# Patient Record
Sex: Male | Born: 1970 | Hispanic: Yes | Marital: Single
Health system: Southern US, Community
[De-identification: ages and names within clinical notes are randomized; demographics above are authoritative.]

---

## 2005-09-20 ENCOUNTER — Emergency Department: Payer: Self-pay | Admitting: General Practice

## 2007-05-25 ENCOUNTER — Other Ambulatory Visit: Payer: Self-pay

## 2007-05-25 ENCOUNTER — Emergency Department: Payer: Self-pay | Admitting: Emergency Medicine

## 2008-05-24 ENCOUNTER — Emergency Department: Payer: Self-pay | Admitting: Emergency Medicine

## 2008-06-01 ENCOUNTER — Emergency Department: Payer: Self-pay | Admitting: Emergency Medicine

## 2008-07-17 ENCOUNTER — Emergency Department: Payer: Self-pay | Admitting: Internal Medicine

## 2009-10-19 ENCOUNTER — Emergency Department: Payer: Self-pay | Admitting: Emergency Medicine

## 2012-03-25 ENCOUNTER — Inpatient Hospital Stay: Payer: Self-pay | Admitting: Internal Medicine

## 2012-03-25 LAB — CBC WITH DIFFERENTIAL/PLATELET
Basophil %: 0.1 %
Eosinophil #: 0 10*3/uL (ref 0.0–0.7)
Eosinophil %: 0 %
HGB: 14.2 g/dL (ref 13.0–18.0)
Lymphocyte #: 0.4 10*3/uL — ABNORMAL LOW (ref 1.0–3.6)
Lymphocyte %: 3.7 %
MCH: 29.9 pg (ref 26.0–34.0)
MCV: 88 fL (ref 80–100)
Monocyte %: 2.7 %
Neutrophil #: 10.8 10*3/uL — ABNORMAL HIGH (ref 1.4–6.5)
Neutrophil %: 93.5 %
Platelet: 148 10*3/uL — ABNORMAL LOW (ref 150–440)
RDW: 13.1 % (ref 11.5–14.5)

## 2012-03-25 LAB — RAPID INFLUENZA A&B ANTIGENS

## 2012-03-25 LAB — COMPREHENSIVE METABOLIC PANEL
Anion Gap: 11 (ref 7–16)
BUN: 13 mg/dL (ref 7–18)
EGFR (African American): >60
EGFR (Non-African Amer.): >60
Glucose: 190 mg/dL — ABNORMAL HIGH (ref 65–99)
SGOT(AST): 42 U/L — ABNORMAL HIGH (ref 15–37)
SGPT (ALT): 38 U/L (ref 12–78)
Sodium: 130 mmol/L — ABNORMAL LOW (ref 136–145)

## 2012-03-26 LAB — CBC WITH DIFFERENTIAL/PLATELET
Eosinophil #: 0 10*3/uL (ref 0.0–0.7)
Eosinophil %: 0 %
HCT: 39.8 % — ABNORMAL LOW (ref 40.0–52.0)
HGB: 13.3 g/dL (ref 13.0–18.0)
Lymphocyte #: 0.7 10*3/uL — ABNORMAL LOW (ref 1.0–3.6)
Monocyte %: 3 %
Neutrophil %: 87.6 %
Platelet: 148 10*3/uL — ABNORMAL LOW (ref 150–440)
RBC: 4.37 10*6/uL — ABNORMAL LOW (ref 4.40–5.90)
RDW: 13 % (ref 11.5–14.5)

## 2012-03-26 LAB — MAGNESIUM: Magnesium: 1.8 mg/dL

## 2012-03-26 LAB — COMPREHENSIVE METABOLIC PANEL
Albumin: 3 g/dL — ABNORMAL LOW (ref 3.4–5.0)
Alkaline Phosphatase: 99 U/L (ref 50–136)
Anion Gap: 9 (ref 7–16)
Bilirubin,Total: 0.8 mg/dL (ref 0.2–1.0)
Calcium, Total: 7.9 mg/dL — ABNORMAL LOW (ref 8.5–10.1)
Chloride: 99 mmol/L (ref 98–107)
Creatinine: 0.91 mg/dL (ref 0.60–1.30)
EGFR (African American): >60
Glucose: 156 mg/dL — ABNORMAL HIGH (ref 65–99)
Potassium: 3.8 mmol/L (ref 3.5–5.1)
SGPT (ALT): 33 U/L (ref 12–78)
Total Protein: 7.2 g/dL (ref 6.4–8.2)

## 2012-03-26 LAB — LIPID PANEL
Cholesterol: 89 mg/dL (ref 0–200)
Ldl Cholesterol, Calc: 27 mg/dL (ref 0–100)
Triglycerides: 291 mg/dL — ABNORMAL HIGH (ref 0–200)
VLDL Cholesterol, Calc: 58 mg/dL — ABNORMAL HIGH (ref 5–40)

## 2012-03-27 LAB — RAPID HIV-1/2 QL/CONFIRM: HIV-1/2,Rapid Ql: NEGATIVE

## 2012-03-28 LAB — CBC WITH DIFFERENTIAL/PLATELET
Basophil %: 0.2 %
Eosinophil #: 0 10*3/uL (ref 0.0–0.7)
HCT: 37.2 % — ABNORMAL LOW (ref 40.0–52.0)
HGB: 12.6 g/dL — ABNORMAL LOW (ref 13.0–18.0)
Lymphocyte #: 1 10*3/uL (ref 1.0–3.6)
Lymphocyte %: 6.4 %
MCH: 31.1 pg (ref 26.0–34.0)
MCHC: 33.9 g/dL (ref 32.0–36.0)
MCV: 92 fL (ref 80–100)
Monocyte #: 0.6 x10 3/mm (ref 0.2–1.0)
Monocyte %: 3.5 %
Neutrophil #: 14.1 10*3/uL — ABNORMAL HIGH (ref 1.4–6.5)
RBC: 4.06 10*6/uL — ABNORMAL LOW (ref 4.40–5.90)
WBC: 15.7 10*3/uL — ABNORMAL HIGH (ref 3.8–10.6)

## 2012-03-28 LAB — BASIC METABOLIC PANEL
Chloride: 98 mmol/L (ref 98–107)
Sodium: 132 mmol/L — ABNORMAL LOW (ref 136–145)

## 2012-03-28 LAB — PHOSPHORUS: Phosphorus: 3.8 mg/dL (ref 2.5–4.9)

## 2012-03-28 LAB — BETA STREP CULTURE(ARMC)

## 2012-03-29 LAB — CBC WITH DIFFERENTIAL/PLATELET
Basophil #: 0 10*3/uL (ref 0.0–0.1)
Basophil %: 0.2 %
Eosinophil #: 0 10*3/uL (ref 0.0–0.7)
HCT: 34.8 % — ABNORMAL LOW (ref 40.0–52.0)
HGB: 11.4 g/dL — ABNORMAL LOW (ref 13.0–18.0)
Lymphocyte #: 1 10*3/uL (ref 1.0–3.6)
MCH: 30.2 pg (ref 26.0–34.0)
MCHC: 32.6 g/dL (ref 32.0–36.0)
MCV: 93 fL (ref 80–100)
Monocyte #: 0.8 x10 3/mm (ref 0.2–1.0)
Monocyte %: 4.4 %
Neutrophil #: 15.2 10*3/uL — ABNORMAL HIGH (ref 1.4–6.5)
Platelet: 243 10*3/uL (ref 150–440)
RBC: 3.76 10*6/uL — ABNORMAL LOW (ref 4.40–5.90)
RDW: 13.4 % (ref 11.5–14.5)
WBC: 17 10*3/uL — ABNORMAL HIGH (ref 3.8–10.6)

## 2012-03-29 LAB — BASIC METABOLIC PANEL
Co2: 30 mmol/L (ref 21–32)
EGFR (African American): >60
EGFR (Non-African Amer.): >60
Osmolality: 284 (ref 275–301)

## 2012-03-29 LAB — PHOSPHORUS: Phosphorus: 4.2 mg/dL (ref 2.5–4.9)

## 2012-03-30 LAB — CBC WITH DIFFERENTIAL/PLATELET
Basophil #: 0 10*3/uL (ref 0.0–0.1)
Basophil %: 0.2 %
HGB: 11.2 g/dL — ABNORMAL LOW (ref 13.0–18.0)
Lymphocyte #: 0.5 10*3/uL — ABNORMAL LOW (ref 1.0–3.6)
MCH: 29.9 pg (ref 26.0–34.0)
MCHC: 32.8 g/dL (ref 32.0–36.0)
MCV: 91 fL (ref 80–100)
Monocyte %: 3.5 %
Platelet: 306 10*3/uL (ref 150–440)

## 2012-03-30 LAB — BASIC METABOLIC PANEL
BUN: 16 mg/dL (ref 7–18)
Creatinine: 0.62 mg/dL (ref 0.60–1.30)
EGFR (African American): >60
Glucose: 182 mg/dL — ABNORMAL HIGH (ref 65–99)
Osmolality: 291 (ref 275–301)

## 2012-03-31 LAB — CULTURE, BLOOD (SINGLE)

## 2012-04-01 LAB — CBC WITH DIFFERENTIAL/PLATELET
Basophil #: 0.3 10*3/uL — ABNORMAL HIGH (ref 0.0–0.1)
Basophil %: 1.5 %
HCT: 34.8 % — ABNORMAL LOW (ref 40.0–52.0)
HGB: 11.6 g/dL — ABNORMAL LOW (ref 13.0–18.0)
Lymphocyte %: 3.1 %
MCH: 30.6 pg (ref 26.0–34.0)
MCHC: 33.3 g/dL (ref 32.0–36.0)
MCV: 92 fL (ref 80–100)
Monocyte #: 0.7 x10 3/mm (ref 0.2–1.0)
Monocyte %: 3.6 %
Neutrophil #: 18.1 10*3/uL — ABNORMAL HIGH (ref 1.4–6.5)
Platelet: 266 10*3/uL (ref 150–440)
RBC: 3.78 10*6/uL — ABNORMAL LOW (ref 4.40–5.90)
RDW: 13.6 % (ref 11.5–14.5)
WBC: 19.7 10*3/uL — ABNORMAL HIGH (ref 3.8–10.6)

## 2012-04-01 LAB — COMPREHENSIVE METABOLIC PANEL
Alkaline Phosphatase: 158 U/L — ABNORMAL HIGH (ref 50–136)
Anion Gap: 7 (ref 7–16)
BUN: 85 mg/dL — ABNORMAL HIGH (ref 7–18)
Bilirubin,Total: 1.3 mg/dL — ABNORMAL HIGH (ref 0.2–1.0)
Chloride: 116 mmol/L — ABNORMAL HIGH (ref 98–107)
EGFR (Non-African Amer.): 20 — ABNORMAL LOW
Glucose: 162 mg/dL — ABNORMAL HIGH (ref 65–99)
SGOT(AST): 59 U/L — ABNORMAL HIGH (ref 15–37)
SGPT (ALT): 68 U/L (ref 12–78)
Sodium: 150 mmol/L — ABNORMAL HIGH (ref 136–145)
Total Protein: 6.6 g/dL (ref 6.4–8.2)

## 2012-04-01 LAB — MAGNESIUM: Magnesium: 3.3 mg/dL — ABNORMAL HIGH

## 2012-04-01 LAB — PHOSPHORUS: Phosphorus: 5.4 mg/dL — ABNORMAL HIGH (ref 2.5–4.9)

## 2012-04-02 LAB — URINALYSIS, COMPLETE
Bacteria: NONE SEEN
Bilirubin,UR: NEGATIVE
Glucose,UR: NEGATIVE mg/dL (ref 0–75)
Ketone: NEGATIVE
Leukocyte Esterase: NEGATIVE
Nitrite: NEGATIVE
Protein: NEGATIVE
Specific Gravity: 1.013 (ref 1.003–1.030)
WBC UR: 5 /HPF (ref 0–5)

## 2012-04-02 LAB — CBC WITH DIFFERENTIAL/PLATELET
Basophil #: 0.1 10*3/uL (ref 0.0–0.1)
Basophil %: 0.3 %
Eosinophil #: 0 10*3/uL (ref 0.0–0.7)
HCT: 36.3 % — ABNORMAL LOW (ref 40.0–52.0)
Lymphocyte %: 3 %
MCH: 30 pg (ref 26.0–34.0)
MCV: 92 fL (ref 80–100)
Monocyte #: 0.9 x10 3/mm (ref 0.2–1.0)
Neutrophil #: 24.1 10*3/uL — ABNORMAL HIGH (ref 1.4–6.5)
Neutrophil %: 93.2 %
Platelet: 256 10*3/uL (ref 150–440)
RBC: 3.93 10*6/uL — ABNORMAL LOW (ref 4.40–5.90)
RDW: 13.6 % (ref 11.5–14.5)

## 2012-04-02 LAB — BASIC METABOLIC PANEL
Anion Gap: 6 — ABNORMAL LOW (ref 7–16)
BUN: 49 mg/dL — ABNORMAL HIGH (ref 7–18)
Chloride: 115 mmol/L — ABNORMAL HIGH (ref 98–107)
Creatinine: 2.69 mg/dL — ABNORMAL HIGH (ref 0.60–1.30)
EGFR (African American): 32 — ABNORMAL LOW
EGFR (African American): >60
EGFR (Non-African Amer.): 28 — ABNORMAL LOW
EGFR (Non-African Amer.): >60
Osmolality: 329 (ref 275–301)
Osmolality: 317 (ref 275–301)
Sodium: 151 mmol/L — ABNORMAL HIGH (ref 136–145)

## 2012-04-02 LAB — EXPECTORATED SPUTUM ASSESSMENT W GRAM STAIN, RFLX TO RESP C

## 2012-04-02 LAB — SODIUM, URINE, RANDOM: Sodium, Urine Random: 98 mmol/L (ref 20–110)

## 2012-04-02 LAB — OSMOLALITY, URINE: Osmolality: 514 mOsm/kg

## 2012-04-03 LAB — BASIC METABOLIC PANEL
Anion Gap: 7 (ref 7–16)
Calcium, Total: 8.3 mg/dL — ABNORMAL LOW (ref 8.5–10.1)
Chloride: 114 mmol/L — ABNORMAL HIGH (ref 98–107)
Co2: 29 mmol/L (ref 21–32)
Creatinine: 0.91 mg/dL (ref 0.60–1.30)
EGFR (African American): >60
Glucose: 186 mg/dL — ABNORMAL HIGH (ref 65–99)
Osmolality: 309 (ref 275–301)

## 2012-04-03 LAB — CBC WITH DIFFERENTIAL/PLATELET
Basophil #: 0 10*3/uL (ref 0.0–0.1)
Eosinophil #: 0 10*3/uL (ref 0.0–0.7)
HGB: 12.2 g/dL — ABNORMAL LOW (ref 13.0–18.0)
Lymphocyte %: 2.7 %
MCHC: 32.6 g/dL (ref 32.0–36.0)
Neutrophil #: 19.7 10*3/uL — ABNORMAL HIGH (ref 1.4–6.5)
Neutrophil %: 95.6 %
Platelet: 283 10*3/uL (ref 150–440)
RBC: 4.03 10*6/uL — ABNORMAL LOW (ref 4.40–5.90)
RDW: 13.3 % (ref 11.5–14.5)
WBC: 20.6 10*3/uL — ABNORMAL HIGH (ref 3.8–10.6)

## 2012-04-03 LAB — T4, FREE: Free Thyroxine: 0.92 ng/dL (ref 0.76–1.46)

## 2012-04-03 LAB — TSH: Thyroid Stimulating Horm: 0.065 u[IU]/mL — ABNORMAL LOW

## 2012-04-04 LAB — CBC WITH DIFFERENTIAL/PLATELET
Basophil #: 0 10*3/uL (ref 0.0–0.1)
HGB: 11.4 g/dL — ABNORMAL LOW (ref 13.0–18.0)
MCV: 92 fL (ref 80–100)
Monocyte #: 0.6 x10 3/mm (ref 0.2–1.0)
Monocyte %: 3.6 %
Platelet: 264 10*3/uL (ref 150–440)

## 2012-04-04 LAB — BASIC METABOLIC PANEL
Anion Gap: 6 — ABNORMAL LOW (ref 7–16)
Calcium, Total: 8 mg/dL — ABNORMAL LOW (ref 8.5–10.1)
Chloride: 108 mmol/L — ABNORMAL HIGH (ref 98–107)
Co2: 29 mmol/L (ref 21–32)
Creatinine: 0.63 mg/dL (ref 0.60–1.30)
Glucose: 107 mg/dL — ABNORMAL HIGH (ref 65–99)
Osmolality: 288 (ref 275–301)

## 2012-04-05 LAB — BASIC METABOLIC PANEL WITH GFR
Anion Gap: 7 (ref 7–16)
BUN: 19 mg/dL — ABNORMAL HIGH (ref 7–18)
Calcium, Total: 7.9 mg/dL — ABNORMAL LOW (ref 8.5–10.1)
Chloride: 104 mmol/L (ref 98–107)
Co2: 28 mmol/L (ref 21–32)
Creatinine: 0.59 mg/dL — ABNORMAL LOW (ref 0.60–1.30)
EGFR (African American): >60
EGFR (Non-African Amer.): >60
Glucose: 154 mg/dL — ABNORMAL HIGH (ref 65–99)
Osmolality: 283 (ref 275–301)
Potassium: 4.2 mmol/L (ref 3.5–5.1)
Sodium: 139 mmol/L (ref 136–145)

## 2012-04-05 LAB — CBC WITH DIFFERENTIAL/PLATELET
Eosinophil #: 0 10*3/uL (ref 0.0–0.7)
Eosinophil %: 0.1 %
HGB: 12 g/dL — ABNORMAL LOW (ref 13.0–18.0)
Lymphocyte #: 1.4 10*3/uL (ref 1.0–3.6)
MCH: 29.9 pg (ref 26.0–34.0)
Monocyte %: 4.7 %
Neutrophil %: 88.4 %
RBC: 4.02 10*6/uL — ABNORMAL LOW (ref 4.40–5.90)

## 2012-04-05 LAB — MAGNESIUM: Magnesium: 1.8 mg/dL

## 2012-04-05 LAB — PHOSPHORUS: Phosphorus: 2.9 mg/dL (ref 2.5–4.9)

## 2012-04-06 LAB — BASIC METABOLIC PANEL
Anion Gap: 5 — ABNORMAL LOW (ref 7–16)
Calcium, Total: 7.7 mg/dL — ABNORMAL LOW (ref 8.5–10.1)
Chloride: 104 mmol/L (ref 98–107)
Glucose: 142 mg/dL — ABNORMAL HIGH (ref 65–99)
Osmolality: 278 (ref 275–301)
Potassium: 4.1 mmol/L (ref 3.5–5.1)
Sodium: 137 mmol/L (ref 136–145)

## 2012-04-06 LAB — CBC WITH DIFFERENTIAL/PLATELET
Basophil #: 0 10*3/uL (ref 0.0–0.1)
Basophil %: 0.2 %
Eosinophil #: 0 10*3/uL (ref 0.0–0.7)
Eosinophil %: 0.2 %
HCT: 35.4 % — ABNORMAL LOW (ref 40.0–52.0)
Lymphocyte %: 5.8 %
MCH: 30 pg (ref 26.0–34.0)
MCHC: 32.8 g/dL (ref 32.0–36.0)
Monocyte #: 1.1 x10 3/mm — ABNORMAL HIGH (ref 0.2–1.0)
Monocyte %: 5 %
Neutrophil #: 19.7 10*3/uL — ABNORMAL HIGH (ref 1.4–6.5)
Platelet: 278 10*3/uL (ref 150–440)
WBC: 22.2 10*3/uL — ABNORMAL HIGH (ref 3.8–10.6)

## 2012-04-06 LAB — MAGNESIUM: Magnesium: 1.8 mg/dL

## 2012-04-06 LAB — PHOSPHORUS: Phosphorus: 2.6 mg/dL (ref 2.5–4.9)

## 2012-04-07 LAB — CBC WITH DIFFERENTIAL/PLATELET
Basophil %: 0 %
Eosinophil #: 0 10*3/uL (ref 0.0–0.7)
Eosinophil %: 0.1 %
Lymphocyte #: 2.2 10*3/uL (ref 1.0–3.6)
Lymphocyte %: 7.1 %
MCH: 30.9 pg (ref 26.0–34.0)
MCHC: 34 g/dL (ref 32.0–36.0)
MCV: 91 fL (ref 80–100)
Monocyte #: 2.2 x10 3/mm — ABNORMAL HIGH (ref 0.2–1.0)
Monocyte %: 7.1 %
RBC: 4.13 10*6/uL — ABNORMAL LOW (ref 4.40–5.90)
RDW: 13.4 % (ref 11.5–14.5)

## 2012-04-07 LAB — HEPATIC FUNCTION PANEL A (ARMC)
Albumin: 2.5 g/dL — ABNORMAL LOW (ref 3.4–5.0)
Bilirubin,Total: 0.8 mg/dL (ref 0.2–1.0)
SGOT(AST): 36 U/L (ref 15–37)
Total Protein: 6.4 g/dL (ref 6.4–8.2)

## 2012-04-07 LAB — URINALYSIS, COMPLETE
Bilirubin,UR: NEGATIVE
Ketone: NEGATIVE
Specific Gravity: 1.01 (ref 1.003–1.030)

## 2012-04-07 LAB — LIPASE, BLOOD: Lipase: 496 U/L — ABNORMAL HIGH (ref 73–393)

## 2012-04-08 LAB — CBC WITH DIFFERENTIAL/PLATELET
HGB: 12.9 g/dL — ABNORMAL LOW (ref 13.0–18.0)
MCH: 30.3 pg (ref 26.0–34.0)
MCHC: 33.5 g/dL (ref 32.0–36.0)
MCV: 91 fL (ref 80–100)
Monocyte %: 6.1 %
Neutrophil #: 22.9 10*3/uL — ABNORMAL HIGH (ref 1.4–6.5)
Neutrophil %: 86.2 %
Platelet: 453 10*3/uL — ABNORMAL HIGH (ref 150–440)
RBC: 4.27 10*6/uL — ABNORMAL LOW (ref 4.40–5.90)
WBC: 26.5 10*3/uL — ABNORMAL HIGH (ref 3.8–10.6)

## 2012-04-08 LAB — URINE CULTURE

## 2012-04-09 LAB — COMPREHENSIVE METABOLIC PANEL
Albumin: 2.8 g/dL — ABNORMAL LOW (ref 3.4–5.0)
Alkaline Phosphatase: 161 U/L — ABNORMAL HIGH (ref 50–136)
BUN: 15 mg/dL (ref 7–18)
Bilirubin,Total: 0.7 mg/dL (ref 0.2–1.0)
Calcium, Total: 8.3 mg/dL — ABNORMAL LOW (ref 8.5–10.1)
Chloride: 105 mmol/L (ref 98–107)
Co2: 28 mmol/L (ref 21–32)
EGFR (African American): >60
EGFR (Non-African Amer.): >60
Glucose: 124 mg/dL — ABNORMAL HIGH (ref 65–99)
Osmolality: 278 (ref 275–301)
Potassium: 3.6 mmol/L (ref 3.5–5.1)
SGOT(AST): 31 U/L (ref 15–37)

## 2012-04-09 LAB — CBC WITH DIFFERENTIAL/PLATELET
Basophil %: 0.3 %
Eosinophil #: 0.1 10*3/uL (ref 0.0–0.7)
HGB: 12.9 g/dL — ABNORMAL LOW (ref 13.0–18.0)
Lymphocyte #: 2 10*3/uL (ref 1.0–3.6)
Lymphocyte %: 7.5 %
MCH: 29.9 pg (ref 26.0–34.0)
MCV: 91 fL (ref 80–100)
Monocyte #: 1.4 x10 3/mm — ABNORMAL HIGH (ref 0.2–1.0)
Monocyte %: 5.1 %
Neutrophil %: 86.8 %

## 2012-04-09 LAB — LIPASE, BLOOD: Lipase: 1087 U/L — ABNORMAL HIGH (ref 73–393)

## 2012-04-10 LAB — CBC WITH DIFFERENTIAL/PLATELET
Eosinophil #: 0.1 10*3/uL (ref 0.0–0.7)
Eosinophil %: 0.6 %
HCT: 41.7 % (ref 40.0–52.0)
HGB: 14 g/dL (ref 13.0–18.0)
Lymphocyte #: 1.5 10*3/uL (ref 1.0–3.6)
Lymphocyte %: 9 %
MCH: 30.5 pg (ref 26.0–34.0)
MCHC: 33.5 g/dL (ref 32.0–36.0)
MCV: 91 fL (ref 80–100)
Monocyte #: 1 x10 3/mm (ref 0.2–1.0)
Monocyte %: 6.2 %
Neutrophil #: 13.7 10*3/uL — ABNORMAL HIGH (ref 1.4–6.5)
Neutrophil %: 84 %
Platelet: 496 10*3/uL — ABNORMAL HIGH (ref 150–440)
WBC: 16.3 10*3/uL — ABNORMAL HIGH (ref 3.8–10.6)

## 2012-04-10 LAB — LIPASE, BLOOD: Lipase: 1918 U/L — ABNORMAL HIGH (ref 73–393)

## 2012-04-11 LAB — CBC WITH DIFFERENTIAL/PLATELET
Basophil #: 0 10*3/uL (ref 0.0–0.1)
Basophil %: 0.4 %
Eosinophil #: 0.2 10*3/uL (ref 0.0–0.7)
Eosinophil %: 1.8 %
HGB: 12.2 g/dL — ABNORMAL LOW (ref 13.0–18.0)
Lymphocyte #: 2 10*3/uL (ref 1.0–3.6)
MCH: 30.4 pg (ref 26.0–34.0)
MCHC: 33.3 g/dL (ref 32.0–36.0)
MCV: 91 fL (ref 80–100)
Monocyte #: 0.8 x10 3/mm (ref 0.2–1.0)
Neutrophil #: 9.3 10*3/uL — ABNORMAL HIGH (ref 1.4–6.5)
Platelet: 447 10*3/uL — ABNORMAL HIGH (ref 150–440)
RDW: 13.9 % (ref 11.5–14.5)
WBC: 12.4 10*3/uL — ABNORMAL HIGH (ref 3.8–10.6)

## 2012-04-11 LAB — BASIC METABOLIC PANEL
BUN: 13 mg/dL (ref 7–18)
Chloride: 106 mmol/L (ref 98–107)
Co2: 26 mmol/L (ref 21–32)
EGFR (African American): >60
EGFR (Non-African Amer.): >60
Glucose: 114 mg/dL — ABNORMAL HIGH (ref 65–99)
Osmolality: 277 (ref 275–301)

## 2012-04-11 LAB — LIPASE, BLOOD: Lipase: 1025 U/L — ABNORMAL HIGH (ref 73–393)

## 2012-04-12 LAB — LIPASE, BLOOD: Lipase: 758 U/L — ABNORMAL HIGH (ref 73–393)

## 2012-04-12 LAB — MAGNESIUM: Magnesium: 1.6 mg/dL — ABNORMAL LOW

## 2012-04-12 LAB — POTASSIUM: Potassium: 3.3 mmol/L — ABNORMAL LOW (ref 3.5–5.1)

## 2012-04-13 LAB — CULTURE, BLOOD (SINGLE)

## 2012-05-09 DIAGNOSIS — E119 Type 2 diabetes mellitus without complications: Secondary | ICD-10-CM | POA: Insufficient documentation

## 2012-05-11 LAB — BASIC METABOLIC PANEL
BUN: 10 mg/dL (ref 4–21)
Creatinine: 0.6 mg/dL (ref 0.6–1.3)
Glucose: 137 mg/dL
Potassium: 4.8 mmol/L (ref 3.4–5.3)
Sodium: 142 mmol/L (ref 137–147)

## 2012-05-11 LAB — HEMOGLOBIN A1C: Hemoglobin A1C: 6.1

## 2012-05-11 LAB — HEPATIC FUNCTION PANEL
ALK PHOS: 108 U/L (ref 25–125)
ALT: 34 U/L (ref 10–40)
AST: 22 U/L (ref 14–40)
BILIRUBIN, TOTAL: 0.3 mg/dL

## 2012-05-11 LAB — CBC AND DIFFERENTIAL
HCT: 42 % (ref 41–53)
Hemoglobin: 14.2 g/dL (ref 13.5–17.5)
NEUTROS ABS: 5 /uL
PLATELETS: 236 10*3/uL (ref 150–399)
WBC: 8 10*3/mL

## 2012-05-31 LAB — LIPID PANEL
CHOLESTEROL: 198 mg/dL (ref 0–200)
HDL: 31 mg/dL — AB (ref 35–70)
LDL CALC: 143 mg/dL
TRIGLYCERIDES: 120 mg/dL (ref 40–160)

## 2014-04-09 IMAGING — CR DG CHEST 2V
1 series · 2 of 2 positions shown · non-contrast
Comparison: none

REASON FOR EXAM: fever; cough
COMMENTS:

PROCEDURE:     DXR - DXR CHEST PA (OR AP) AND LATERAL  - March 25, 2012  [DATE]
RESULT:     Comparison: None.

[Series 1: w chest pa · 0.14mm/px · 2 of 2 slices shown]
[im 1/2]
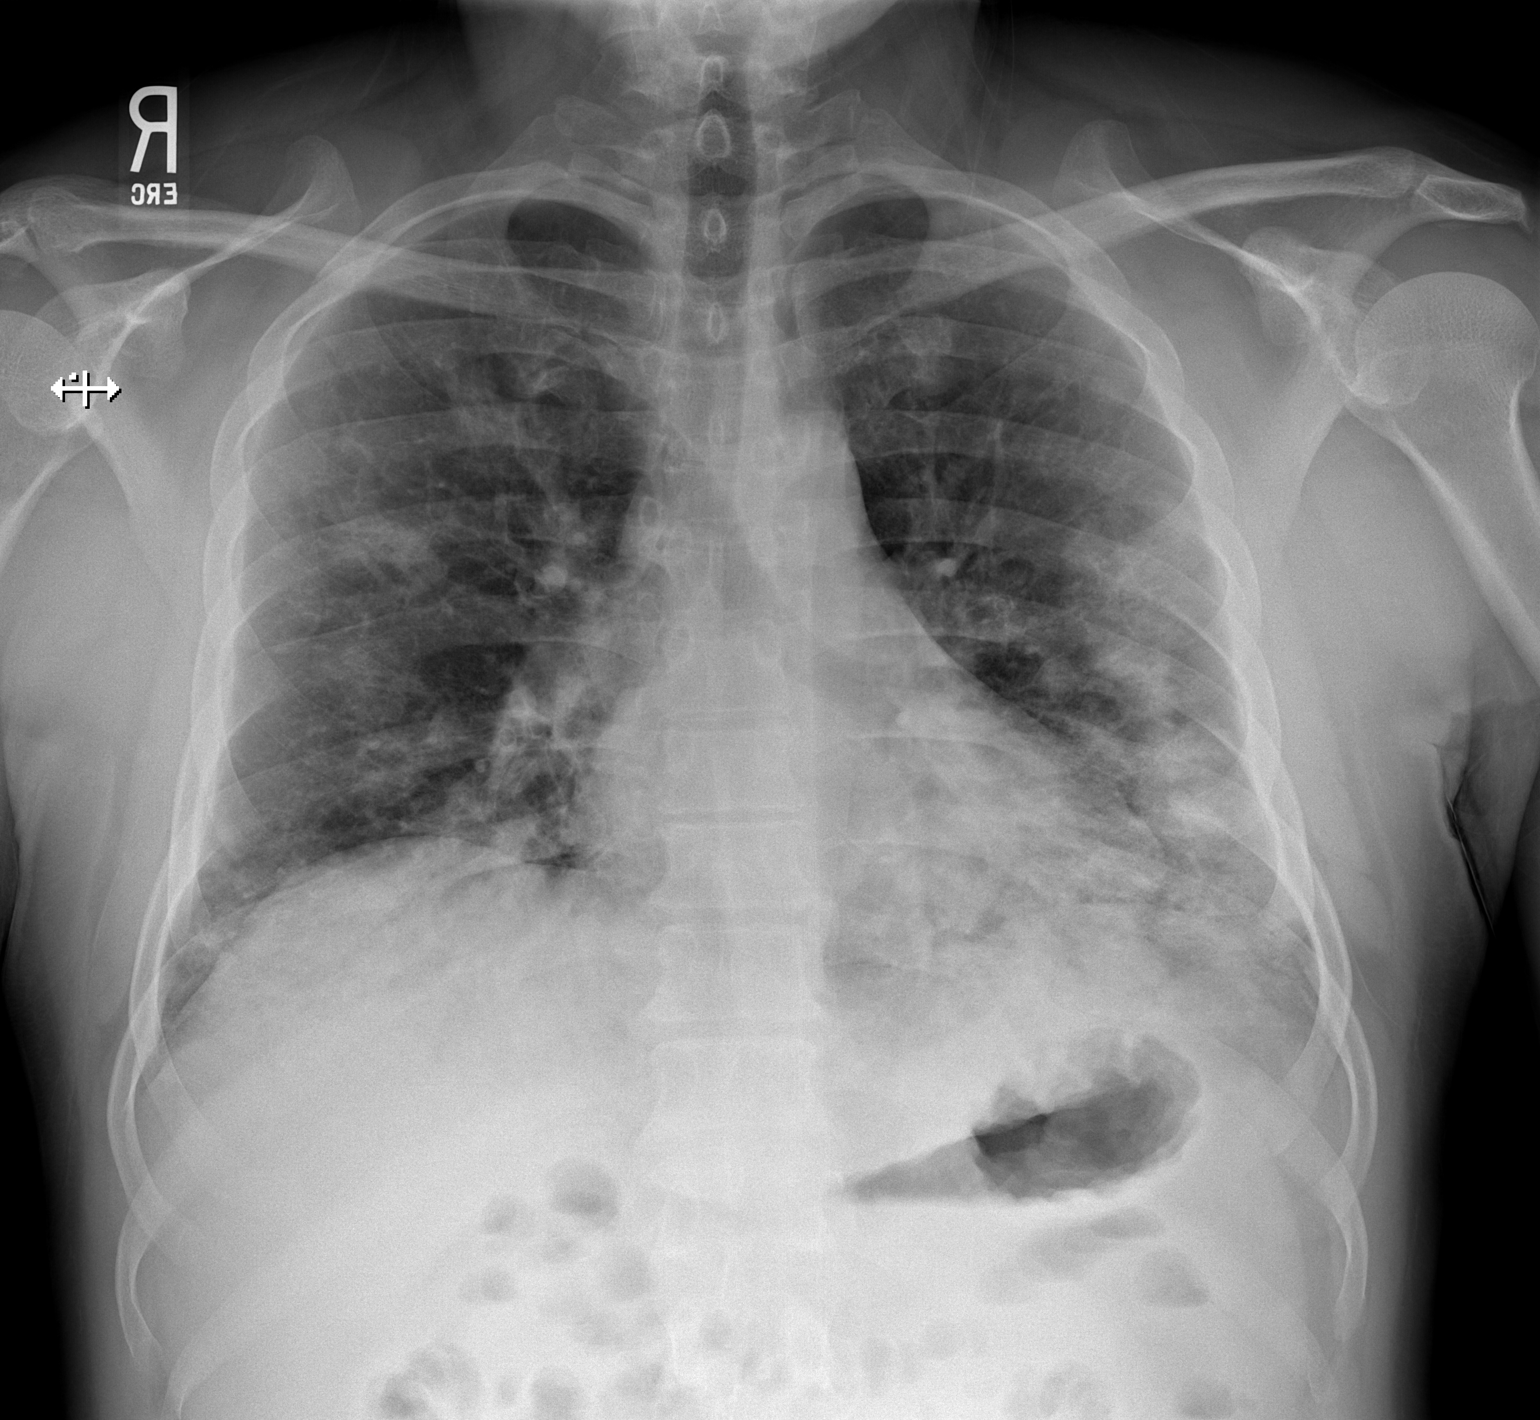
[im 2/2]
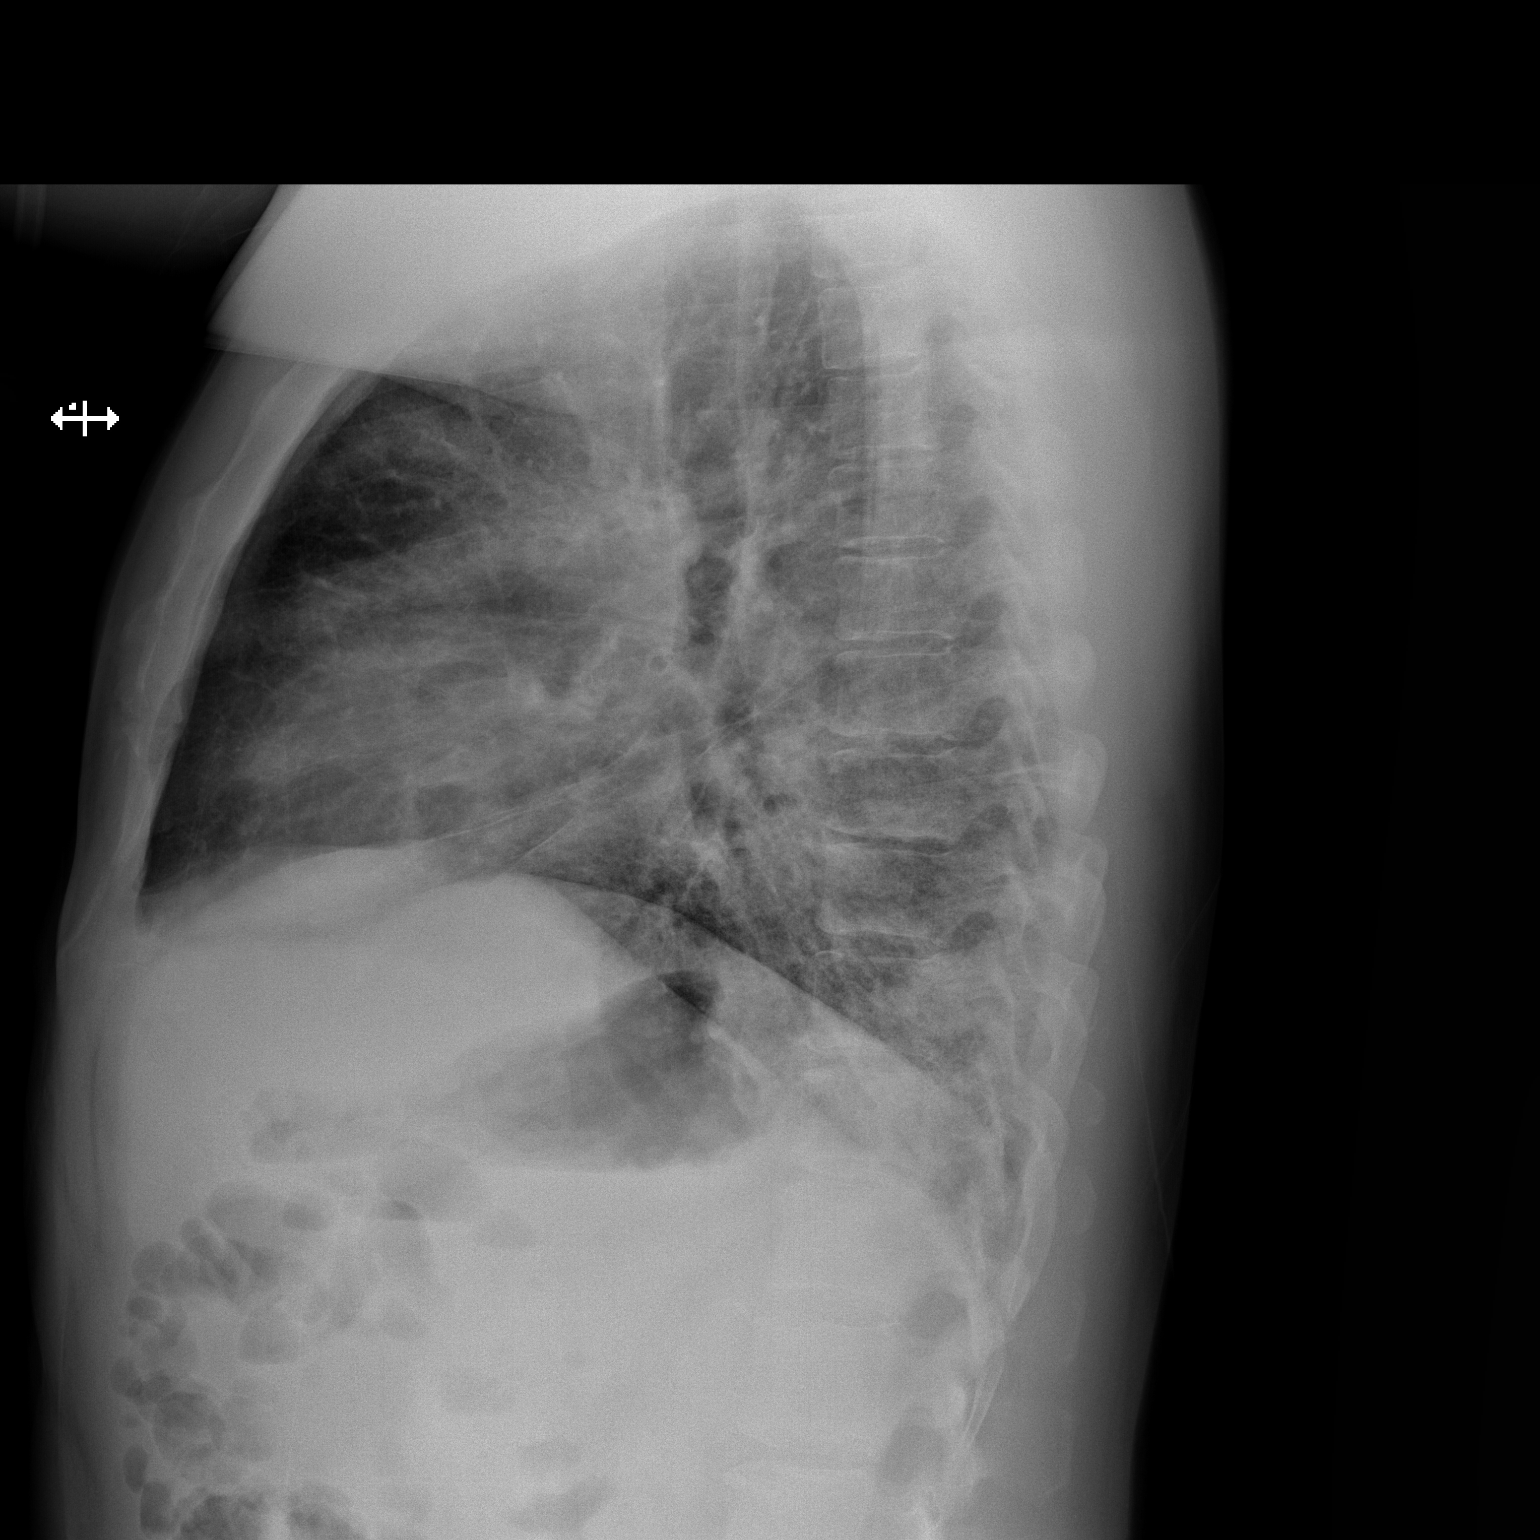

[2 of 2 positions shown; findings below may reference images not displayed]

FINDINGS: The heart and mediastinum are within normal limits. There heterogeneous
opacities throughout the lungs, greatest in the left mid and lower lung.
IMPRESSION: Bilateral heterogeneous opacities. Differential includes infection,
including atypical infection, as well as nonspecific interstitial lung
disease. Followup PA and lateral chest radiograph is recommended.

## 2014-04-11 IMAGING — CR DG CHEST 1V PORT
1 series · 1 of 1 positions shown · non-contrast
Comparison: none

REASON FOR EXAM: post intubation
COMMENTS:  *** There is an active Special Droplet Isolation on this patient.
***

[ap]
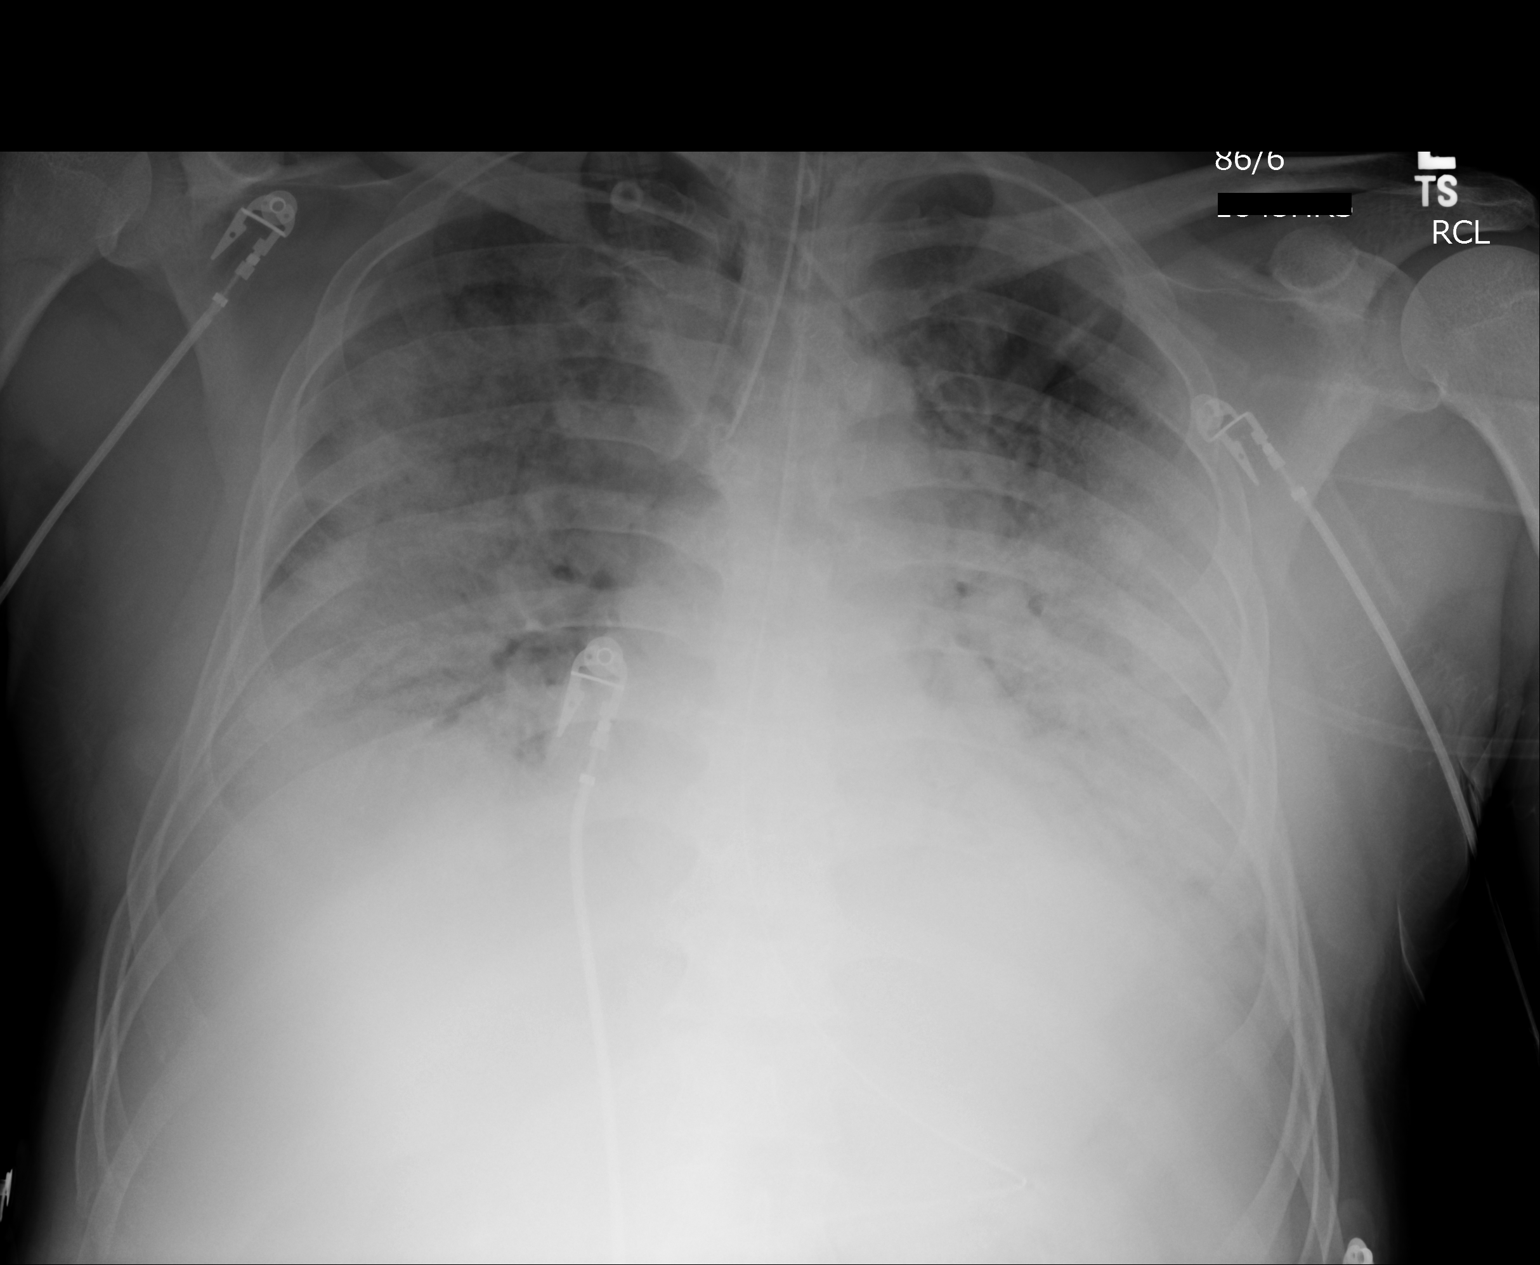

[1 of 1 positions shown; findings below may reference images not displayed]

PROCEDURE:     DXR - DXR PORTABLE CHEST SINGLE VIEW  - March 27, 2012  [DATE]

RESULT:     Endotracheal tube and NG tube in good position. Bilateral dense
pulmonary edema noted. Heart size normal. Bilateral pulmonary infiltrates
could be related pneumonia or process such as ARDS as well as acute
congestive heart failure.
IMPRESSION: Good tube positioning. Progressive severe dense bilateral
pulmonary infiltrates with near opacification of both lungs.

## 2014-04-11 IMAGING — CR DG CHEST 1V PORT
1 series · 1 of 1 positions shown · non-contrast
Comparison: none

REASON FOR EXAM: ett placement
COMMENTS:  *** There is an active Special Droplet Isolation on this patient.
***

PROCEDURE:     DXR - DXR PORTABLE CHEST SINGLE VIEW  - March 27, 2012  [DATE]
RESULT:     Endotracheal and NG tube noted in good position endotracheal
tube has been withdrawn slightly and is in good position. Dense bilateral
pulmonary infiltrates remain. Heart size normal.

[ap]
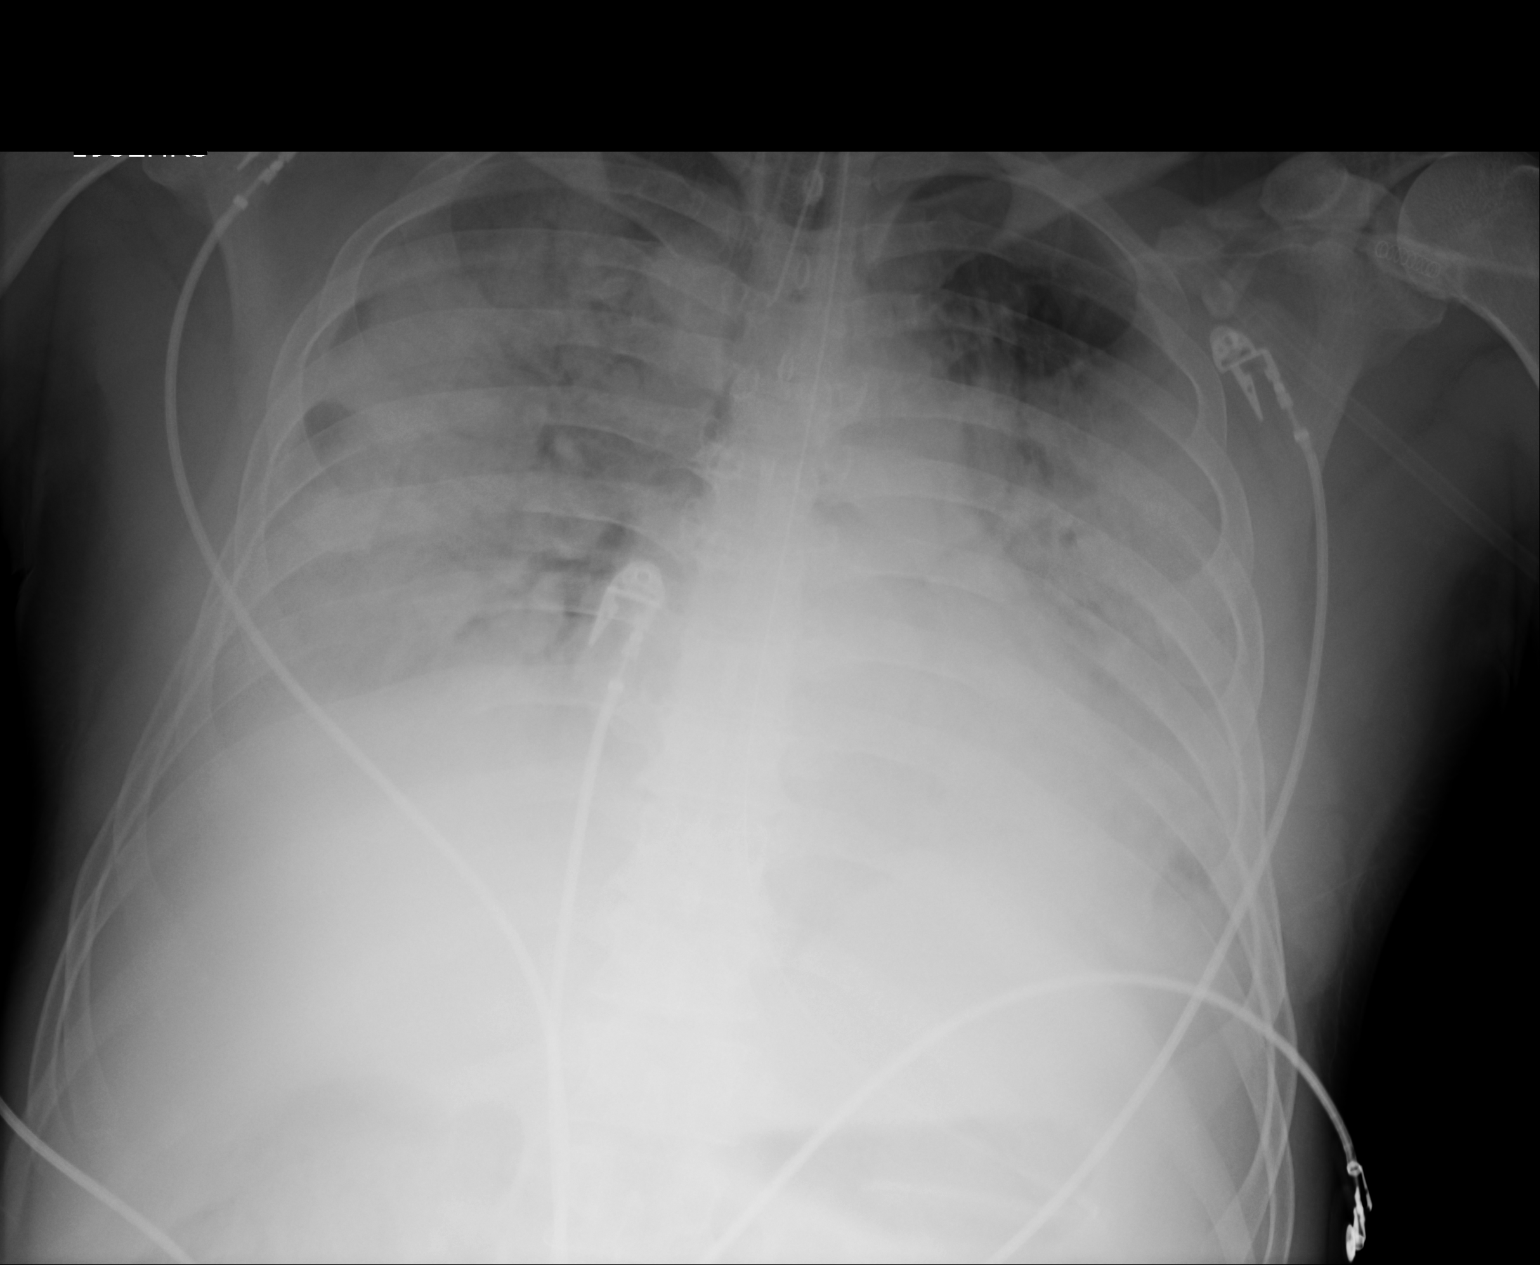

[1 of 1 positions shown; findings below may reference images not displayed]

IMPRESSION: 1. Interim slight withdrawal of endotracheal tube. Tube in good position.
2. Dense bilateral pulmonary infiltrates.

## 2014-04-16 IMAGING — CR DG CHEST 1V PORT
1 series · 1 of 1 positions shown · non-contrast
Comparison: none

REASON FOR EXAM: on vent
COMMENTS:

[ap]
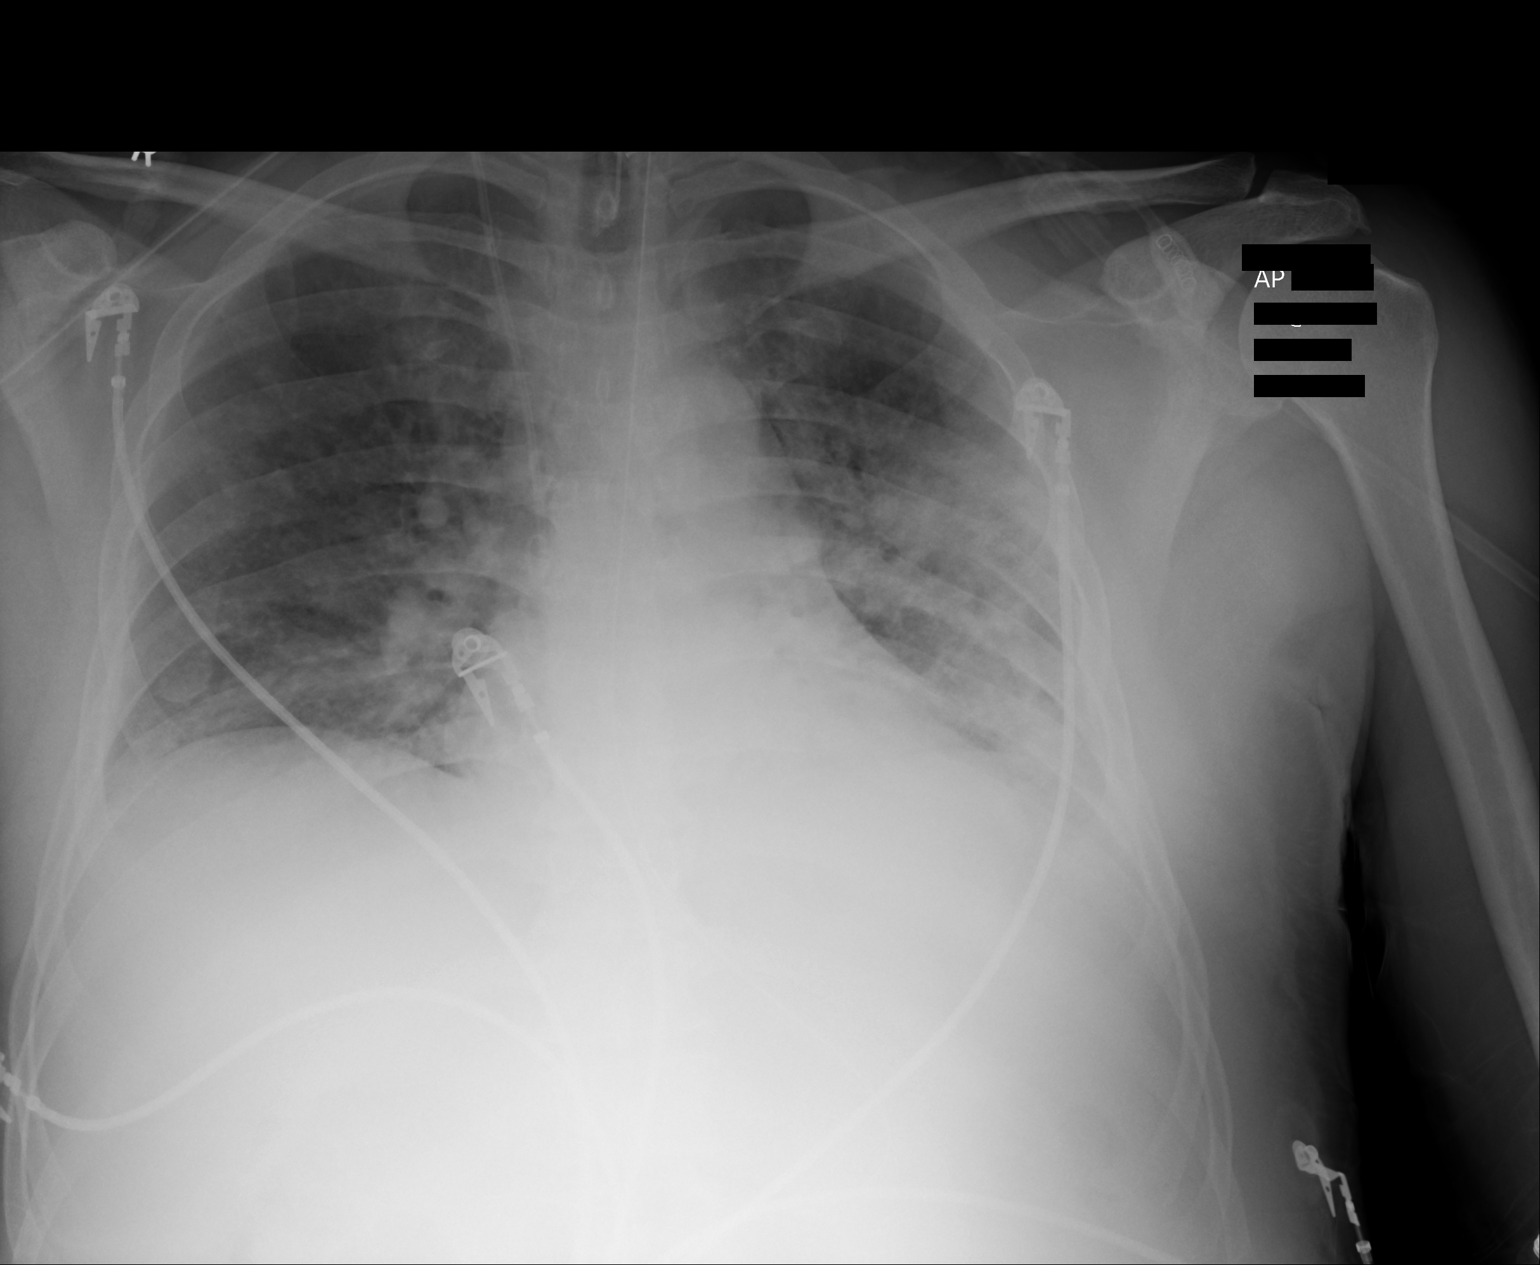

[1 of 1 positions shown; findings below may reference images not displayed]

PROCEDURE:     DXR - DXR PORTABLE CHEST SINGLE VIEW  - April 01, 2012  [DATE]

RESULT:     Comparison is made to prior study dated 03/30/2012.

The patient has taken a shallow inspiration. An endotracheal tube is
appreciated with tip at the level of the clavicles. An NG tube is seen with
tip not on the view of this study. A right-sided central venous catheter is
appreciated with tip at the level of the superior vena cava right atrial
junction.

An area of increased density once again projects in the region of the left
lower lobe. There is prominence of the interstitial markings and mild
peribronchial cuffing. The interstitial findings are likely accentuated
secondary to shallow inspiration. There has been improved aeration within
the right hemithorax. The visualized bony skeleton is unremarkable.
IMPRESSION: 1. Persistent left lower lobe infiltrate.
2. Support lines and tubes as described above.
3. An underlying component of interstitial infiltrate, edematous versus
infectious or inflammatory cannot be excluded. Continued surveillance
evaluation recommended.

## 2014-06-07 NOTE — Discharge Summary (Signed)
PATIENT NAME:  Austin Ferrell, Austin Ferrell MR#:  [id number] DATE OF BIRTH:  May 23, 1970  DATE OF ADMISSION:  03/25/2012 DATE OF DISCHARGE:  04/12/2012  PRIMARY CARE PHYSICIAN:  At [facility]  FINAL DIAGNOSES: 1.  Acute respiratory failure, adult respiratory distress syndrome picture required intubation. This had all resolved.  2.  Systemic inflammatory response syndrome, pneumonia, influenza and hemoptysis.  3.  Acute renal failure and hypernatremia. 4.  Diabetes.  5.  Tobacco abuse.  6.  Abdominal pain.  Elevated lipase, possible pancreatitis.  7.  Weakness.  8.  Diarrhea.  9.  Urinary retention.  10.  Hypokalemia and hypomagnesemia.   MEDICATIONS ON DISCHARGE:  Include potassium chloride 10 mEq daily for 5 days, magnesium oxide 1 tablet twice a day for 5 days.   DIET:  Low-residue diet for 5 days then regular diet, regular consistency.   ACTIVITY:  As tolerated.  FOLLOWUP:  At the [facility] as scheduled 03/25 at 7:00 p.m.  The patient was admitted with fever and cough.   HISTORY OF PRESENT ILLNESS:  A 44 year old Hispanic man with a history of diabetes, tobacco abuse, came into the ER with dizziness with ambulation, found to be hypoxic, pulse ox dropping to 87%. The patient was admitted with acute respiratory failure, secondary to pneumonia and possible COPD.  He was started on oxygen, Rocephin and Zithromax.  LABORATORY AND RADIOLOGICAL DATA DURING THE HOSPITAL COURSE:  Glucose of 190, BUN 13, creatinine 1.0, sodium 130, potassium 3.0, chloride 95, CO2 24, calcium 8.6. Liver function tests: AST slightly elevated at 42. White blood cell count 11.6, H and H 14.2 and 41.7, platelet count of 148. Influenza A and B were negative.  Chest x-ray: Bilateral heterogenous opacities.  Blood cultures negative. Legionella urine antigen negative. LDL 27, HDL 4, triglycerides 291, hemoglobin A1c 6.5. Acid-fast culture negative. Sputum culture:  Moderate growth of gram-negative rod unable to  identify organism. Acid-fast smear negative. HIV negative.  CT chest: Bilateral pneumonia. No pulmonary embolism. No dissection.  H1N1:  I am not sure how this test was resulted. It looks like it was positive, but I am not sure. Another acid-fast was negative. Pneumocystis hypersensitivity panel negative. ANA negative. Sedimentation rate 71, ANCA panel negative. ACE 41. C-reactive protein on February 16th, 30.8.   CT scan of the head negative and that was on February 17.  QuantiFERON Gold indeterminate.   The repeat H1N1 was positive.  T3 by RIA 44, _____ receptor antibody normal range. Free T serum 1.2.  CT scan of the abdomen and pelvis on February 21 showed thickening of the rectal wall nonspecific, lung bases consistent with atelectasis and interstitial infiltrates, marked improvement, distention of the stomach with gas.  Stool for C. diff was negative. Lipase was 848 on the 22nd, went up as high as 1918 on the 24th, down to 758 upon discharge.  Upon discharge, magnesium 1.6, potassium 3.3.  HOSPITAL COURSE PER PROBLEM LIST:  1.  For the patient's acute respiratory failure:  The patient decompensated quickly. He was admitted on February 8 and required high-flow nasal cannula, then transferred to the unit on March 27, 2012 and was intubated that evening by Dr. Meredeth Ide, pulmonary. The patient remained intubated until April 06, 2012, and then he was tapered slowly off the oxygen. At the time of discharge on April 12, 2012, the patient was off oxygen. He had an adult respiratory distress syndrome pictured during the intubation period requiring high PEEP and high amounts of oxygen.  The patient was critically ill.  2.  For the patient's systemic inflammatory response syndrome, bilateral pneumonia:  His H1N1 was positive. He also had hemoptysis. The patient was initially started on Rocephin and Zithromax, Day 2.  On February 9, Tamiflu was added and he completed a course of Tamiflu. He  completed course of antibiotics throughout the hospitalization, was on high-dose steroids. He completed all antibiotics during the hospitalization and off steroids also. I believe this was all secondary to the flu.  3.  Acute renal failure and hypernatremia:  This had improved with IV fluid hydration. The patient was seen in consultation by nephrology, Dr. Cherylann RatelLateef.  Last creatinine on record 0.55. 4.  For the patient's diabetes:  The patient's hemoglobin A1c was only 6.5. Diet can control this as an outpatient, but during the hospital course while he was on the ventilator, he was on the hyperglycemia protocol requiring insulin drip secondary to the high-dose steroids. 5.  For his tobacco abuse:  He was smoking cessation counseling done earlier in the hospital course. He is off nicotine patch at this point.  6.  Abdominal pain after extubation and elevated lipase, possible pancreatitis:  He was started on liquid diet and he had a CT scan of the abdomen which was negative. Of note when I did start the food, the lipase went up to 1900. I put him back on liquid diet. The patient did not have any pain for 4 days prior to discharge. He tolerated diet.  His lipase did come down into the 700 range prior to discharge.  7.  For his weakness:  He required extensive physical therapy training after extubation. He was very weak. At the time of discharge, he only needed a cane.  8.  Diarrhea:  His stool for clostridium difficile was negative. Diarrhea had resolved upon discharge.  9.  Urinary retention:  He required urecholine to stimulate him to go, probably secondary to the Foley being in for such a long time and being intubated. He was urinating fine at the time of discharge.  10. Hypokalemia and hypomagnesemia:  This was replaced during the hospital stay and upon discharge. Likely this is secondary to being on liquid diet. The patient tolerated regular diet prior to time of discharge; we will give a few days of  potassium and magnesium supplementation.   Overall, the patient was critically ill and it is amazing that he had a remarkable course and recovered. I believe this is all secondary to H1N1 influenza. 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 I did speak with Dr. Leavy CellaBlocker, infectious disease. We do not have to treat this since the patient is recovered and is  human immunodeficiency virus negative, but it did not result in the computer at this point.   TIME SPENT ON DISCHARGE:  35 minutes    ____________________________ Herschell Dimesichard J. Renae GlossWieting, MD rjw:ce D: 04/12/2012 17:15:02 ET T: 04/12/2012 18:26:32 ET JOB#: [id number]  cc: Herschell Dimesichard J. Renae GlossWieting, MD, <Dictator>  [facility] Salley ScarletICHARD J Zahra Peffley MD ELECTRONICALLY SIGNED 04/18/2012 13:15

## 2014-06-07 NOTE — Consult Note (Signed)
PATIENT NAME:  Austin Ferrell, Austin Ferrell MR#:  [id number] DATE OF BIRTH:  03-16-1970  DATE OF CONSULTATION:  04/04/2012  REFERRING PHYSICIAN:  Munsoor Lateef, MD CONSULTING PHYSICIAN:  A. Wendall Mola, MD  CHIEF COMPLAINT: Abnormal thyroid function tests and polyuria.   HISTORY OF PRESENT ILLNESS: This is a 44 year old male seen in consultation for the above complaints. His chart was reviewed and the patient was examined. He was unable to be interviewed as he is intubated and sedated. The patient presented initially on 03/25/2012 to the ER and was found to have a bilateral pneumonia and a history of diabetes, which he was not treating with medication. The patient was hypoxic and at that time had a low sodium of 130. He was initially treated with supportive care, IV antibiotics and inhalers. His hypoxia continued and due to increased respiratory distress, the patient was intubated on his second hospital day. Glucocorticoids were added to reduce pulmonary inflammation, resulting in higher blood sugars. Hemoglobin A1c was only 6.5%. On 04/01/2012, he developed acute renal failure with an increase in his creatinine to 3.5 and increase in his sodium to 150. This has subsequently improved with normalization of his creatinine in the last 24 hours. On 04/02/2012, he developed polyuria with urine output of 7 liters over a 24-hour period. Due to the finding of polyuria and hypernatremia, there was concern that the patient had diabetes insipidus. A dose of DDAVP was given which made no change in his urine output. On 04/03/12, his sodium level improved considerably when renal failure resolved. Sodium today is normal at 143 and creatinine is normal at 0.63. Additionally, on February 17, he was found to have a low TSH of 0.065 with a normal free T4 of 0.92. The patient has no known history of thyroid disease, although it is difficult to obtain an accurate history in this situation.   PAST MEDICAL HISTORY: None.   PAST SURGICAL  HISTORY: None.   ALLERGIES: No known drug allergies.   FAMILY HISTORY: Not known.   SOCIAL HISTORY: The patient emigrated from [place] 16 years ago. He smoked on a daily basis and drink alcohol, several beers on a daily basis.   REVIEW OF SYSTEMS: Unable to obtain.   PHYSICAL EXAMINATION: VITAL SIGNS: Height 67.9 inches, weight 176 pounds, BMI 26.8, temperature 98.4, pulse 58, respirations 23, blood pressure 88/53, pulse oximetry 94% on mechanical ventilator.  GENERAL: Critically ill-appearing Hispanic male. No appreciable periorbital edema or proptosis.  NECK: Unable to examine, right central line in the neck is in place.  HEENT: OG and NG tube in place.  PULMONARY: No apparent wheeze on mechanical ventilator.  CARDIAC: Regular rate and rhythm. No appreciable murmur.  ABDOMEN: Distended, hypoactive bowel sounds.  EXTREMITIES: No edema is present.  SKIN: No rash or dermatopathy is appreciated.  NEUROLOGIC: Unable to assess.  PSYCHIATRIC: Unable to assess.   LABORATORY, DIAGNOSTIC AND RADIOLOGICAL DATA:  Laboratory as per HPI.   Additional studies: CT head 04/03/2012, showed a normal noncontrast head CT. No evidence of hemorrhage or infarct.   ASSESSMENT: 1. Polyuria and hypernatremia in the setting of acute renal failure, now resolved.  Cause of polyuria and hypernatremia most likely is acute tubular necrosis, as suggested by nephrology. Urine sodium and urine osmolality, as well low serum sodium and osmolality were reviewed and these were not consistent with diabetes insipidus. Additionally, failure of response to DDAVP suggests this is not diabetes insipidus.  2.  Abnormal thyroid function tests, specifically with low TSH and normal free  T4 level.  This lab pattern is consistent with possible euthyroid sick syndrome and/or suppression of TSH in the setting of high-dose glucocorticoids.  Hyperthyroidism is a possibility, although seems less likely. My plan is to obtain a total T3 level  and thyroid autoantibodies. In hyperthyroidism, I would expect his T3 to be elevated and in autoimmune thyroid disease, I would expect thyroid antibodies to be elevated. If this is euthyroid sick, it should improve as his clinical situation improves. If this is due to glucocorticoids, TSH should normalize within several days after glucocorticoids are stopped. Neither of these latter conditions would require clinical intervention.   I will follow along with you regarding his abnormal thyroid function tests as results become available.   ____________________________ A. Wendall MolaMelissa Delonta Yohannes, MD ams:cc D: 04/04/2012 17:06:47 ET T: 04/04/2012 20:55:57 ET JOB#: [id number] cc: A. Wendall MolaMelissa Malena Timpone, MD, <Dictator> Macy MisA. MELISSA Shanty Ginty MD ELECTRONICALLY SIGNED 04/09/2012 20:19
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
## 2014-06-07 NOTE — Consult Note (Signed)
PATIENT NAME:  Austin Ferrell, NOLASCO MR#:  [id number] DATE OF BIRTH:  Nov 02, 1970  DATE OF CONSULTATION:  04/03/2012  REFERRING PHYSICIAN:  Felipa Furnace, MD CONSULTING PHYSICIAN:  Lennox Pippins, MD  REASON FOR CONSULTATION: Polyuria.   HISTORY OF PRESENT ILLNESS: The patient is a 44 year old Hispanic male with history of diabetes mellitus who presented originally to [facility], on 03/25/2012, with fever and cough. History is obtained through chart review and discussion with hospitalist. The patient has been working as a Education administrator prior to admission. He presented to the Emergency Department with fever and cough and was noted to be hypoxic. He was found to have bilateral pneumonia on CT scan of the chest. Hospital course was complicated by acute respiratory failure requiring intubation and mechanical ventilation. Upon presentation he was found to have normal renal function with a creatinine of 0.81. However, hospital course was complicated by acute renal failure and his creatinine peaked at 3.53, on 04/01/2012. Creatinine has been trending down. Since that point in time, the patient has developed polyuria and has been producing more urine than intake. He has also developed hypernatremia with a serum sodium of 150. The patient was administered DDAVP as it was felt that he may potentially have diabetes insipidus. However, this did not reduce his urine output very much. He appears to be critically ill at present. TSH was noted to be quite low at 0.06.   PAST MEDICAL HISTORY:  Diabetes mellitus that was untreated.   PAST SURGICAL HISTORY: None.   ALLERGIES: No known drug allergies.   CURRENT INPATIENT MEDICATIONS:  1. Precedex drip. 2. Fentanyl drip.  3. Midazolam drip. 4. Insulin drip. 5. Normal saline 0.9 at 10 mL/hour. 6. Acetaminophen 650 mg p.o. q. 4 hours p.r.n.  7. Zofran 4 mg p.o. q. 4 four hours p.r.n. 8. Albuterol ipratropium 8 puffs inhaled q. 4  hours. 9. Linezolid 600 mg IV q. 12 hours. 10. Morphine 1 mg IV q. 3 hours p.r.n. pain.  11. Ativan 2 mg IV q. 1 hour p.r.n. anxiety.  12. Multivitamin 5 mL p.o. daily. 13. Pantoprazole 40 mg IV q. 6:00 a.m.  14. Vecuronium 5 mg IV q. 2 hours p.r.n. tachypnea. 15. Lorazepam 2 mg IV q. 2 hours p.r.n.  16. Methylprednisolone 40 mg IV q. 8 hours. 17. Zosyn 3.375 grams IV q. 8 hours. 18. Desmopressin 4 mcg IV q. 12 hours.  19. Levofloxacin 750 mg IV q. 24 hours.   SOCIAL HISTORY: Unable to obtain directly from the patient. However, per dictated history and physical, it appears that he has history of tobacco abuse and was drinking alcohol daily.   FAMILY HISTORY: Unable to obtain directly from the patient. However, it appears that there is a family history of diabetes mellitus.  REVIEW OF SYSTEMS:  Unable to obtain from the patient as he is intubated and sedated.   PHYSICAL EXAMINATION: VITAL SIGNS: Temperature 98.5, pulse 62, respirations 24, blood pressure 124/72, pulse ox 94% on the ventilator.  GENERAL: Well-developed, well-nourished Hispanic male who appears to be critically ill.  HEENT: Normocephalic, atraumatic. Spontaneous extraocular movements are noted. Pupils were 4 mm and reactive. Endotracheal tube was noted to be in place.  NECK: Supple and without JVD or lymphadenopathy.  LUNGS: Demonstrate coarse rhonchi bilaterally. Breath sounds are vent assisted.  CARDIOVASCULAR: S1, S2 regular rate and rhythm. No murmurs, rubs or gallops appreciated.  ABDOMEN: Soft, nontender and nondistended. Bowel sounds positive. No rebound or guarding. No gross organomegaly appreciated.  EXTREMITIES: No clubbing, cyanosis or edema noted.  NEUROLOGIC: The patient is currently intubated and sedated.  GENITOURINARY: Foley catheter noted to be in place.  MUSCULOSKELETAL: No joint redness, swelling or tenderness appreciated.   SKIN: Warm and dry. No rashes noted.  PSYCHIATRIC: Unable to assess at this  time as the patient is intubated and sedated.   LABORATORY DATA: Sodium 150, potassium 4.4, chloride 114, CO2 29, BUN 31, creatinine 0.91, glucose 186. TSH is low at 0.065. CBC shows WBC 20.6, hemoglobin 12.2, hematocrit 37, and platelets 283. Urine sodium was 98. Urine osmolality was 514. Sputum culture showed gram-negative rods. Urinalysis from 04/01/2012 showed specific gravity 1.013, negative for protein, negative for nitrites, 16 RBCs per high-power field, 5 WBCs per high-power field.  AFB x 1 is negative. ANCA panel is thus far negative. ABG shows pH 7.48, pCO2 42, pO2 69 and FiO2 45%.   IMPRESSION: This is a 44 year old Hispanic male with past medical history of untreated diabetes mellitus who presented to [facility] with bilateral pneumonia and hospital course complicated by acute respiratory distress syndrome, respiratory failure, acute renal failure which is resolving, polyuria and hypernatremia.   PROBLEM LIST: 1. Hypernatremia.  2. Polyuria.  3. Recent acute renal failure most likely secondary to acute tubular necrosis, improving.  4. Respiratory failure.  5. Abnormal and low TSH.   PLAN: The patient presents with a very interesting case. He originally presented with respiratory failure secondary to bilateral pneumonia and has developed acute respiratory distress syndrome. The patient had a period of time where he developed acute renal failure. He subsequently developed polyuria. Polyuria can be seen in post ATN phase which leads to significant solute loss. Usually DDAVP is not effective in such a situation. Therefore, we would recommend at this point in time increasing fluid input. Since he is hypernatremic, we would opt for D5W at 200 mL per hour. In addition, his TSH was noted to be quite low. Hyperthyroidism at times can lead to polyuria although the mechanism is unclear. We will obtain endocrinology consultation to assess this a bit further. We would recommend  continued ventilatory support at this point in time. Overall the patient appears to be critically ill and has a high chance for further decompensation. Further plan to be based upon the patient's progress. ____________________________ Lennox PippinsMunsoor N. Talisha Erby, MD mnl:sb D: 04/03/2012 11:43:57 ET    T: 04/03/2012 12:16:40 ET        JOB#: [id number] cc: Lennox PippinsMunsoor N. Brodee Mauritz, MD, <Dictator> Ria CommentMUNSOOR N Chelsia Serres MD ELECTRONICALLY SIGNED 05/19/2012 11:30

## 2014-06-07 NOTE — Consult Note (Signed)
Chief Complaint and History:  Referring Physician Dr. Cherylann RatelLateef   Chief Complaint Low TSH   Allergies:  No Known Allergies:   Assessment/Plan:  Assessment/Plan 44 yo M critically ill in ICU with bilat pna, intubated and sedated who was found to have low TSH of 0.065 uIU/ml, nml free T4 of 0.92 ng/dl, and a low free T3 level of 1.2 pg/ml. Chart was reviewed and pt was examined. No known h/o thyroid disease.   A/ Abnormal TFTs specifically with low TSH, low free T3 and nml free T4. In a critically ill idividual, this pattern is consistent with sick euthyroid. This condition would be expected to self resolve as his clinical condition improves. It likely is not playing a rolein his critical illness, rather is a reaction to the illness. I recommend repeating TFTs in 5-7 days. I will follow along as those labs are resulted.  A full consult will be dictated.   Electronic Signatures: Raj JanusSolum, Rondy Krupinski M (MD)  (Signed 18-Feb-14 08:11)  Authored: Chief Complaint and History, ALLERGIES, Assessment/Plan   Last Updated: 18-Feb-14 08:11 by Raj JanusSolum, Kamonte Mcmichen M (MD)

## 2015-09-24 DIAGNOSIS — E119 Type 2 diabetes mellitus without complications: Secondary | ICD-10-CM
